# Patient Record
Sex: Male | Born: 1985 | Hispanic: No | Smoking: Former smoker
Health system: Southern US, Community
[De-identification: ages and names within clinical notes are randomized; demographics above are authoritative.]

## PROBLEM LIST (undated history)

## (undated) DIAGNOSIS — J45909 Unspecified asthma, uncomplicated: Secondary | ICD-10-CM

## (undated) HISTORY — PX: NOSE SURGERY: SHX723

---

## 2011-02-07 ENCOUNTER — Emergency Department: Payer: Self-pay | Admitting: Emergency Medicine

## 2011-05-20 ENCOUNTER — Emergency Department: Payer: Self-pay | Admitting: Emergency Medicine

## 2016-08-20 ENCOUNTER — Emergency Department: Payer: Managed Care, Other (non HMO)

## 2016-08-20 ENCOUNTER — Encounter: Payer: Self-pay | Admitting: Emergency Medicine

## 2016-08-20 ENCOUNTER — Emergency Department
Admission: EM | Admit: 2016-08-20 | Discharge: 2016-08-20 | Disposition: A | Discharge status: Home / Self Care | Payer: Managed Care, Other (non HMO) | Attending: Student in an Organized Health Care Education/Training Program | Admitting: Student in an Organized Health Care Education/Training Program

## 2016-08-20 DIAGNOSIS — M25562 Pain in left knee: Secondary | ICD-10-CM | POA: Diagnosis not present

## 2016-08-20 DIAGNOSIS — S0990XA Unspecified injury of head, initial encounter: Secondary | ICD-10-CM

## 2016-08-20 DIAGNOSIS — Y92410 Unspecified street and highway as the place of occurrence of the external cause: Secondary | ICD-10-CM | POA: Insufficient documentation

## 2016-08-20 DIAGNOSIS — Y9389 Activity, other specified: Secondary | ICD-10-CM | POA: Insufficient documentation

## 2016-08-20 DIAGNOSIS — M25522 Pain in left elbow: Secondary | ICD-10-CM

## 2016-08-20 DIAGNOSIS — M79641 Pain in right hand: Secondary | ICD-10-CM | POA: Diagnosis not present

## 2016-08-20 DIAGNOSIS — S0181XA Laceration without foreign body of other part of head, initial encounter: Secondary | ICD-10-CM | POA: Diagnosis not present

## 2016-08-20 DIAGNOSIS — Y999 Unspecified external cause status: Secondary | ICD-10-CM | POA: Diagnosis not present

## 2016-08-20 MED ORDER — TRAMADOL HCL 50 MG PO TABS
50.0000 mg | ORAL_TABLET | Freq: Four times a day (QID) | ORAL | 0 refills | Status: AC | PRN
Start: 2016-08-20 — End: ?

## 2016-08-20 MED ORDER — NAPROXEN 500 MG PO TABS: 500.0000 mg | ORAL_TABLET | Freq: Two times a day (BID) | ORAL | 0 refills | Status: AC

## 2016-08-20 MED ORDER — CYCLOBENZAPRINE HCL 10 MG PO TABS: 10.0000 mg | ORAL_TABLET | Freq: Three times a day (TID) | ORAL | 0 refills | Status: AC | PRN
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
## 2018-03-10 ENCOUNTER — Emergency Department
Admission: EM | Admit: 2018-03-10 | Discharge: 2018-03-11 | Disposition: A | Discharge status: Home / Self Care | Payer: No Typology Code available for payment source | Attending: Emergency Medicine | Admitting: Emergency Medicine

## 2018-03-10 ENCOUNTER — Emergency Department: Payer: No Typology Code available for payment source

## 2018-03-10 ENCOUNTER — Other Ambulatory Visit: Payer: Self-pay

## 2018-03-10 DIAGNOSIS — Y939 Activity, unspecified: Secondary | ICD-10-CM | POA: Diagnosis not present

## 2018-03-10 DIAGNOSIS — S0990XA Unspecified injury of head, initial encounter: Secondary | ICD-10-CM | POA: Diagnosis not present

## 2018-03-10 DIAGNOSIS — S30811A Abrasion of abdominal wall, initial encounter: Secondary | ICD-10-CM | POA: Insufficient documentation

## 2018-03-10 DIAGNOSIS — Z79899 Other long term (current) drug therapy: Secondary | ICD-10-CM | POA: Diagnosis not present

## 2018-03-10 DIAGNOSIS — Y92410 Unspecified street and highway as the place of occurrence of the external cause: Secondary | ICD-10-CM | POA: Insufficient documentation

## 2018-03-10 DIAGNOSIS — J45909 Unspecified asthma, uncomplicated: Secondary | ICD-10-CM | POA: Insufficient documentation

## 2018-03-10 DIAGNOSIS — Z87891 Personal history of nicotine dependence: Secondary | ICD-10-CM | POA: Insufficient documentation

## 2018-03-10 DIAGNOSIS — Y999 Unspecified external cause status: Secondary | ICD-10-CM | POA: Diagnosis not present

## 2018-03-10 DIAGNOSIS — S01311A Laceration without foreign body of right ear, initial encounter: Secondary | ICD-10-CM | POA: Diagnosis present

## 2018-03-10 DIAGNOSIS — F1910 Other psychoactive substance abuse, uncomplicated: Secondary | ICD-10-CM

## 2018-03-10 DIAGNOSIS — R1012 Left upper quadrant pain: Secondary | ICD-10-CM | POA: Diagnosis not present

## 2018-03-10 DIAGNOSIS — J019 Acute sinusitis, unspecified: Secondary | ICD-10-CM

## 2018-03-10 HISTORY — DX: Unspecified asthma, uncomplicated: J45.909

## 2018-03-10 MED ORDER — IOPAMIDOL (ISOVUE-300) INJECTION 61%
100.0000 mL | Freq: Once | INTRAVENOUS | Status: AC | PRN
  Administered 2018-03-10: 100 mL via INTRAVENOUS

## 2018-03-10 MED ORDER — LIDOCAINE-EPINEPHRINE (PF) 1 %-1:200000 IJ SOLN
INTRAMUSCULAR | Status: AC
  Administered 2018-03-11: 30 mL
  Filled 2018-03-10: qty 30
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
## 2018-03-10 NOTE — ED Notes (Signed)
Patient transported to CT 
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
## 2018-03-11 LAB — COMPREHENSIVE METABOLIC PANEL
ALBUMIN: 4.4 g/dL (ref 3.5–5.0)
ALT: 18 U/L (ref 17–63)
ANION GAP: 9 (ref 5–15)
AST: 32 U/L (ref 15–41)
Alkaline Phosphatase: 49 U/L (ref 38–126)
BILIRUBIN TOTAL: 0.9 mg/dL (ref 0.3–1.2)
BUN: 10 mg/dL (ref 6–20)
CALCIUM: 8.6 mg/dL — AB (ref 8.9–10.3)
CO2: 27 mmol/L (ref 22–32)
Chloride: 104 mmol/L (ref 101–111)
Creatinine, Ser: 0.89 mg/dL (ref 0.61–1.24)
GLUCOSE: 107 mg/dL — AB (ref 65–99)
Potassium: 3 mmol/L — ABNORMAL LOW (ref 3.5–5.1)
Sodium: 140 mmol/L (ref 135–145)
TOTAL PROTEIN: 7.6 g/dL (ref 6.5–8.1)

## 2018-03-11 LAB — URINE DRUG SCREEN, QUALITATIVE (ARMC ONLY)
Amphetamines, Ur Screen: NOT DETECTED
BARBITURATES, UR SCREEN: NOT DETECTED
BENZODIAZEPINE, UR SCRN: NOT DETECTED
CANNABINOID 50 NG, UR ~~LOC~~: POSITIVE — AB
COCAINE METABOLITE, UR ~~LOC~~: POSITIVE — AB
MDMA (Ecstasy)Ur Screen: NOT DETECTED
METHADONE SCREEN, URINE: NOT DETECTED
Opiate, Ur Screen: NOT DETECTED
Phencyclidine (PCP) Ur S: NOT DETECTED
TRICYCLIC, UR SCREEN: NOT DETECTED

## 2018-03-11 LAB — PROTIME-INR
INR: 1.13
PROTHROMBIN TIME: 14.4 s (ref 11.4–15.2)

## 2018-03-11 LAB — ETHANOL: Alcohol, Ethyl (B): 203 mg/dL — ABNORMAL HIGH (ref <10)

## 2018-03-11 LAB — TYPE AND SCREEN
ABO/RH(D): A POS
ANTIBODY SCREEN: NEGATIVE

## 2018-03-11 LAB — CBC
HCT: 44.2 % (ref 40.0–52.0)
Hemoglobin: 14.7 g/dL (ref 13.0–18.0)
MCH: 31 pg (ref 26.0–34.0)
MCHC: 33.2 g/dL (ref 32.0–36.0)
MCV: 93.5 fL (ref 80.0–100.0)
PLATELETS: 263 10*3/uL (ref 150–440)
RBC: 4.73 MIL/uL (ref 4.40–5.90)
RDW: 13.8 % (ref 11.5–14.5)
WBC: 9.9 10*3/uL (ref 3.8–10.6)

## 2018-03-11 LAB — APTT: aPTT: 29 seconds (ref 24–36)

## 2018-03-11 LAB — LIPASE, BLOOD: LIPASE: 24 U/L (ref 11–51)

## 2018-03-11 MED ORDER — POTASSIUM CHLORIDE CRYS ER 20 MEQ PO TBCR
40.0000 meq | EXTENDED_RELEASE_TABLET | ORAL | Status: AC
  Administered 2018-03-11: 40 meq via ORAL
  Filled 2018-03-11: qty 2

## 2018-03-11 MED ORDER — BACITRACIN ZINC 500 UNIT/GM EX OINT
TOPICAL_OINTMENT | CUTANEOUS | Status: AC
  Filled 2018-03-11: qty 0.9

## 2018-03-11 MED ORDER — BACITRACIN ZINC 500 UNIT/GM EX OINT
TOPICAL_OINTMENT | Freq: Two times a day (BID) | CUTANEOUS | Status: DC
  Administered 2018-03-11: 1 via TOPICAL

## 2018-03-11 MED ORDER — CEPHALEXIN 500 MG PO CAPS: 500.0000 mg | ORAL_CAPSULE | Freq: Four times a day (QID) | ORAL | 0 refills | Status: AC

## 2018-03-11 MED ORDER — LIDOCAINE-EPINEPHRINE 2 %-1:100000 IJ SOLN
30.0000 mL | Freq: Once | INTRAMUSCULAR | Status: AC
  Administered 2018-03-11: 30 mL via INTRADERMAL

## 2018-03-11 NOTE — ED Notes (Signed)
Pt ambulated in hallway with one assist, pt reports left side flank pain with ambulation. Pt states increased pain with movement.

## 2018-03-11 NOTE — ED Notes (Signed)
Pt drinking water without difficulty at this time, pt talking on phone, appears in NAD at this time.

## 2018-03-11 NOTE — Consult Note (Signed)
03/11/2018  1:40 AM    Sharen HeckBrett Sebring  [id number]   Pre-Op Diagnosis: Right external ear laceration, complex  Post-op Diagnosis: Same  Procedure:   Complex closure of right external ear laceration with wound debridement, 6 cm combined length of closures  Surgeon:  Sandi MealyPaul S Trevante Tennell  Anesthesia: Local  EBL: Minimal  Complications:  None  Findings: Complex laceration of the right external ear with the lobe avulsed from the adjacent face, and multiple lacerations involving the cartilaginous portion of the ear including the concha where there was exposed and macerated cartilage.  Procedure: After discussing the procedure with the patient the ear was anesthetized with 1% lidocaine with epinephrine 1-200,000.  The wound was then carefully cleaned with saline and Betadine, inspecting the extent of injury.  The avulsion injury of the lobe was repaired in an interrupted fashion, using 5-0 Vicryl suture for deep closure to secure the low back to the adjacent face.  Some devitalized tissue was carefully debrided.  The skin was closed with 5-0 Ethilon suture.  Lacerations of the upper ear including the helix, antihelix and concha were closed with 5-0 Ethilon suture.  Some of the macerated cartilage of the concha was debrided.  The total combined length of the closure was roughly 6 cm.  The patient tolerated procedure well.  Recommend bacitracin be applied 3 times daily and the wound is cleaned with peroxide as needed.  Antistaphylococcal coverage with Keflex would also be recommended.  Sutures can be removed in 1 week    Sandi Mealyaul S Berda Shelvin 03/11/2018 1:40 AM

## 2018-03-11 NOTE — ED Notes (Signed)
This RN to bedside, introduced self to patient who is resting in bed comfortably with NAD noted. VSS. Pt denies any needs at this time. Verified with patient that his wife was coming to get him around 0800. Pt states understanding of delay. Will continue to monitor for further patient needs, VSS and WNL.

## 2018-03-11 NOTE — ED Notes (Signed)
Pt ambulated with stand by assist to in rm bathroom. Pt able to give specimen. Pt ambulated back to bed with stand by assist, resting back in bed.
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
## 2018-03-11 NOTE — ED Notes (Signed)
Pt sleeping at this time, audible snoring can be heard, pt with equal and unlabored resp noted.
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
## 2018-03-11 NOTE — ED Notes (Addendum)
Dr. Willeen CassBennett in rm w/ pt, this RN and Mayra EDT at bedside for assistance
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
